# Patient Record
Sex: Male | Born: 1970 | Race: White | Hispanic: No | Marital: Single
Health system: Southern US, Community
[De-identification: ages and names within clinical notes are randomized; demographics above are authoritative.]

## PROBLEM LIST (undated history)

## (undated) DIAGNOSIS — I10 Essential (primary) hypertension: Secondary | ICD-10-CM

## (undated) DIAGNOSIS — J45909 Unspecified asthma, uncomplicated: Secondary | ICD-10-CM

## (undated) DIAGNOSIS — E119 Type 2 diabetes mellitus without complications: Secondary | ICD-10-CM

---

## 2015-11-25 ENCOUNTER — Emergency Department: Payer: Self-pay

## 2015-11-25 ENCOUNTER — Emergency Department
Admission: EM | Admit: 2015-11-25 | Discharge: 2015-11-25 | Discharge status: Against Medical Advice | Payer: Self-pay | Attending: Emergency Medicine | Admitting: Emergency Medicine

## 2015-11-25 ENCOUNTER — Encounter: Payer: Self-pay | Admitting: Medical Oncology

## 2015-11-25 DIAGNOSIS — I10 Essential (primary) hypertension: Secondary | ICD-10-CM | POA: Insufficient documentation

## 2015-11-25 DIAGNOSIS — R739 Hyperglycemia, unspecified: Secondary | ICD-10-CM

## 2015-11-25 DIAGNOSIS — M25511 Pain in right shoulder: Secondary | ICD-10-CM

## 2015-11-25 DIAGNOSIS — R000 Tachycardia, unspecified: Secondary | ICD-10-CM

## 2015-11-25 DIAGNOSIS — F1123 Opioid dependence with withdrawal: Secondary | ICD-10-CM | POA: Insufficient documentation

## 2015-11-25 DIAGNOSIS — F1193 Opioid use, unspecified with withdrawal: Secondary | ICD-10-CM

## 2015-11-25 DIAGNOSIS — E1165 Type 2 diabetes mellitus with hyperglycemia: Secondary | ICD-10-CM | POA: Insufficient documentation

## 2015-11-25 DIAGNOSIS — J45909 Unspecified asthma, uncomplicated: Secondary | ICD-10-CM | POA: Insufficient documentation

## 2015-11-25 HISTORY — DX: Type 2 diabetes mellitus without complications: E11.9

## 2015-11-25 HISTORY — DX: Unspecified asthma, uncomplicated: J45.909

## 2015-11-25 HISTORY — DX: Essential (primary) hypertension: I10

## 2015-11-25 LAB — BASIC METABOLIC PANEL
Anion gap: 9 (ref 5–15)
BUN: 7 mg/dL (ref 6–20)
CO2: 25 mmol/L (ref 22–32)
CREATININE: 0.68 mg/dL (ref 0.61–1.24)
Calcium: 9.1 mg/dL (ref 8.9–10.3)
Chloride: 97 mmol/L — ABNORMAL LOW (ref 101–111)
GFR calc Af Amer: >60 mL/min (ref >60)
Glucose, Bld: 579 mg/dL (ref 65–99)
POTASSIUM: 3.8 mmol/L (ref 3.5–5.1)
SODIUM: 131 mmol/L — AB (ref 135–145)

## 2015-11-25 LAB — CBC
HEMATOCRIT: 43.9 % (ref 40.0–52.0)
Hemoglobin: 15.5 g/dL (ref 13.0–18.0)
MCH: 31.1 pg (ref 26.0–34.0)
MCHC: 35.4 g/dL (ref 32.0–36.0)
MCV: 88 fL (ref 80.0–100.0)
PLATELETS: 496 10*3/uL — AB (ref 150–440)
RBC: 4.99 MIL/uL (ref 4.40–5.90)
RDW: 12.8 % (ref 11.5–14.5)
WBC: 8.9 10*3/uL (ref 3.8–10.6)

## 2015-11-25 LAB — TROPONIN I

## 2015-11-25 MED ORDER — SODIUM CHLORIDE 0.9 % IV BOLUS (SEPSIS)
1000.0000 mL | INTRAVENOUS | Status: AC
  Administered 2015-11-25: 1000 mL via INTRAVENOUS

## 2015-11-25 NOTE — ED Triage Notes (Addendum)
Pt reports that he has been having bilt shoulder pain and sob x 1 week. Pt reports he was sent from [facility] and he feels really anxious. Took ativan at 0900.

## 2015-11-25 NOTE — ED Notes (Signed)
Patient becomes irate during interview. Refuses to stay, threatening MD, insists IV removed.

## 2015-11-25 NOTE — Discharge Instructions (Signed)
As we discussed, we believe your symptoms of pain and rapid heart rate are most likely due to opioid withdrawal.  We are giving IV fluids to try to help you with your high blood sugar as well as getting x-rays of your shoulder, but you are refusing any additional treatment and leaving against medical advice.  Please know you can return to the Emergency Department at any time for further evaluation.

## 2015-11-25 NOTE — ED Notes (Signed)
Blood sugar 579, Dr.Stafford notified

## 2015-11-25 NOTE — ED Notes (Signed)
Patient has 800 ml NS infused.

## 2015-11-25 NOTE — ED Provider Notes (Signed)
[facility] Emergency Department Provider Note  ____________________________________________   First MD Initiated Contact with Patient 11/25/15 1346     (approximate)  I have reviewed the triage vital signs and the nursing notes.   HISTORY  Chief Complaint Shoulder Pain and Shortness of Breath   HPI Nathan Keller is a 45 y.o. male who reports a history of untreated and uncontrolled diabetes, hypertension, heart attack last year, and ongoing heroin addiction for which he just went into his residential treatment services [facility]yesterday for treatment.  He states that he last used heroin more than 24 hours ago.  He presents today for evaluation of bilateral shoulder pain.  He reports that his shoulders have been hurting him since his heart attack last year.  It is much worse today.  He denies fever/chills, chest pain, shortness of breath, nausea, vomiting, diarrhea, abdominal pain.  It is worse with movement and he describes the pain as sharp and severe particularly in his right shoulder blade.  Of note, he was brought back to an exam room quickly because his labs showed that his blood sugar was nearly 600.  He states that this is always the case and that he does not look after his diabetes or his high blood sugar in general.   Past Medical History:  Diagnosis Date  . Asthma   . Diabetes mellitus without complication (HCC)   . Hypertension     There are no active problems to display for this patient.   History reviewed. No pertinent surgical history.  Prior to Admission medications   Not on File    Allergies Hydrocodone  No family history on file.  Social History Social History  Substance Use Topics  . Smoking status: Not on file  . Smokeless tobacco: Not on file  . Alcohol use Not on file    Review of Systems Constitutional: No fever/chills Eyes: No visual changes. ENT: No sore throat. Cardiovascular: Denies chest pain. Respiratory:  Denies shortness of breath. Gastrointestinal: No abdominal pain.  No nausea, no vomiting.  No diarrhea.  No constipation. Genitourinary: Negative for dysuria. Musculoskeletal: Negative for back pain. Skin: Negative for rash. Neurological: Negative for headaches, focal weakness or numbness.  10-point ROS otherwise negative.  ____________________________________________   PHYSICAL EXAM:  VITAL SIGNS: ED Triage Vitals  Enc Vitals Group     BP 11/25/15 1215 117/81     Pulse Rate 11/25/15 1215 (!) 135     Resp 11/25/15 1215 20     Temp 11/25/15 1215 98.4 F (36.9 C)     Temp Source 11/25/15 1215 Oral     SpO2 11/25/15 1215 97 %     Weight 11/25/15 1213 145 lb (65.8 kg)     Height 11/25/15 1213 6' (1.829 m)     Head Circumference --      Peak Flow --      Pain Score 11/25/15 1213 8     Pain Loc --      Pain Edu? --      Excl. in GC? --     Constitutional: The patient is sleeping soundly when I first entered the exam room but awoke to light voice. Eyes: Conjunctivae are normal. PERRL. EOMI. Head: Atraumatic. Nose: No congestion/rhinnorhea. Mouth/Throat: Mucous membranes are moist.  Oropharynx non-erythematous. Neck: No stridor.  No meningeal signs.   Cardiovascular: Tachycardia, regular rhythm. Good peripheral circulation. Grossly normal heart sounds. Respiratory: Normal respiratory effort.  No retractions. Lungs CTAB. Gastrointestinal: Multiple body habitus.  Soft  and nontender. No distention.  Musculoskeletal: No lower extremity tenderness nor edema. No gross deformities of extremities.  Audible crepitus when the patient is fully ranging his right shoulder and shoulder blade, but there is no limitation to the range of motion.  He reports tenderness upon range of motion.  Neurovascularly intact distally. Neurologic:  Normal speech and language. No gross focal neurologic deficits are appreciated.  No tremor. Skin:  Skin is warm, dry and intact. No rash noted. Psychiatric: Mood  and affect are aggressive and hostile after he decided I was not going to give him pain medicine.   ____________________________________________   LABS (all labs ordered are listed, but only abnormal results are displayed)  Labs Reviewed  BASIC METABOLIC PANEL - Abnormal; Notable for the following:       Result Value   Sodium 131 (*)    Chloride 97 (*)    Glucose, Bld 579 (*)    All other components within normal limits  CBC - Abnormal; Notable for the following:    Platelets 496 (*)    All other components within normal limits  TROPONIN I   ____________________________________________  EKG  ED ECG REPORT I, Ivie Maese, the attending physician, personally viewed and interpreted this ECG.  Date: 11/25/2015 EKG Time: 12:13 Rate: 135 Rhythm: sinus tachycardia QRS Axis: normal Intervals: normal ST/T Wave abnormalities: normal Conduction Disturbances: none Narrative Interpretation: no evidence of acute ischemia  ____________________________________________  RADIOLOGY   Dg Chest 2 View  Result Date: 11/25/2015 CLINICAL DATA:  Chest pain short of breath EXAM: CHEST  2 VIEW COMPARISON:  None. FINDINGS: Heart size within normal limits. Right and left coronary stents. Plate and screw fixation of the sternum. Negative for heart failure. Lungs are clear without infiltrate or effusion. Negative for mass lesion. IMPRESSION: No active cardiopulmonary disease. Electronically Signed   By: Marlan Palauharles  Clark M.D.   On: 11/25/2015 13:25    ____________________________________________   PROCEDURES  Procedure(s) performed:   Procedures   Critical Care performed: No ____________________________________________   INITIAL IMPRESSION / ASSESSMENT AND PLAN / ED COURSE  Pertinent labs & imaging results that were available during my care of the patient were reviewed by me and considered in my medical decision making (see chart for details).  After gently awakening the patient we  had a conversation about his workup.  I explained that his chest x-ray was unremarkable including his bones that are visible on the chest film.  I explained to him that his blood sugars extremely elevated but I understand that it is typically elevated, but that was why we are giving him a liter bolus of fluids.  He denied having any recent traumas or injuries to his shoulders.  I was in the process of trying to explain that I felt that his pain was worse and he was feeling worse in general today because of heroin withdrawal and he immediately started ripping off all of the cardiac leads.  I asked him what he was doing and he said "were done here, I can tell you ain't gonna do nothing."  I explained to him that I wanted to try to help him but he was very agitated and upset and began reaching for his peripheral IV.  I pointed at him and sternly told him not to remove the IV or he would hurt himself and make a big mess and if he will just wait a second I will take it out myself.  He threatened to break my finger if  I did not get it away and continue to issue threats and act verbally aggressive.  A nurse walked by and I asked her to get a police officer to the room.  The patient did not act physically aggressive but continue to claim that I was judging him because he is an addict and that I would not do anything for him.  Continue to explain that I am happy to treat him for his high blood sugar and to obtain x-rays of his shoulder to make sure there is no acute injury, but he refused any additional treatment and stormed out of the exam room AGAINST MEDICAL ADVICE once we removed the peripheral IV in the company of the police officer who escorted him to the front.  I prepared paperwork for him but I am uncertain if he accepted it from the nurse.    ____________________________________________  FINAL CLINICAL IMPRESSION(S) / ED DIAGNOSES  Final diagnoses:  Hyperglycemia  Opioid withdrawal (HCC)  Shoulder pain,  acute, right  Tachycardia     MEDICATIONS GIVEN DURING THIS VISIT:  Medications  sodium chloride 0.9 % bolus 1,000 mL (1,000 mLs Intravenous New Bag/Given 11/25/15 1331)     NEW OUTPATIENT MEDICATIONS STARTED DURING THIS VISIT:  New Prescriptions   No medications on file      Note:  This document was prepared using Dragon voice recognition software and may include unintentional dictation errors.    Loleta Rose, MD 11/25/15 [phone]

## 2018-03-27 IMAGING — CR DG CHEST 2V
1 series · 2 of 2 positions shown · non-contrast
Comparison: None.

CLINICAL DATA: Chest pain short of breath

EXAM:
CHEST  2 VIEW

[Series 1: w chest pa · 0.14mm/px · 2 of 2 slices shown]
[im 1/2]
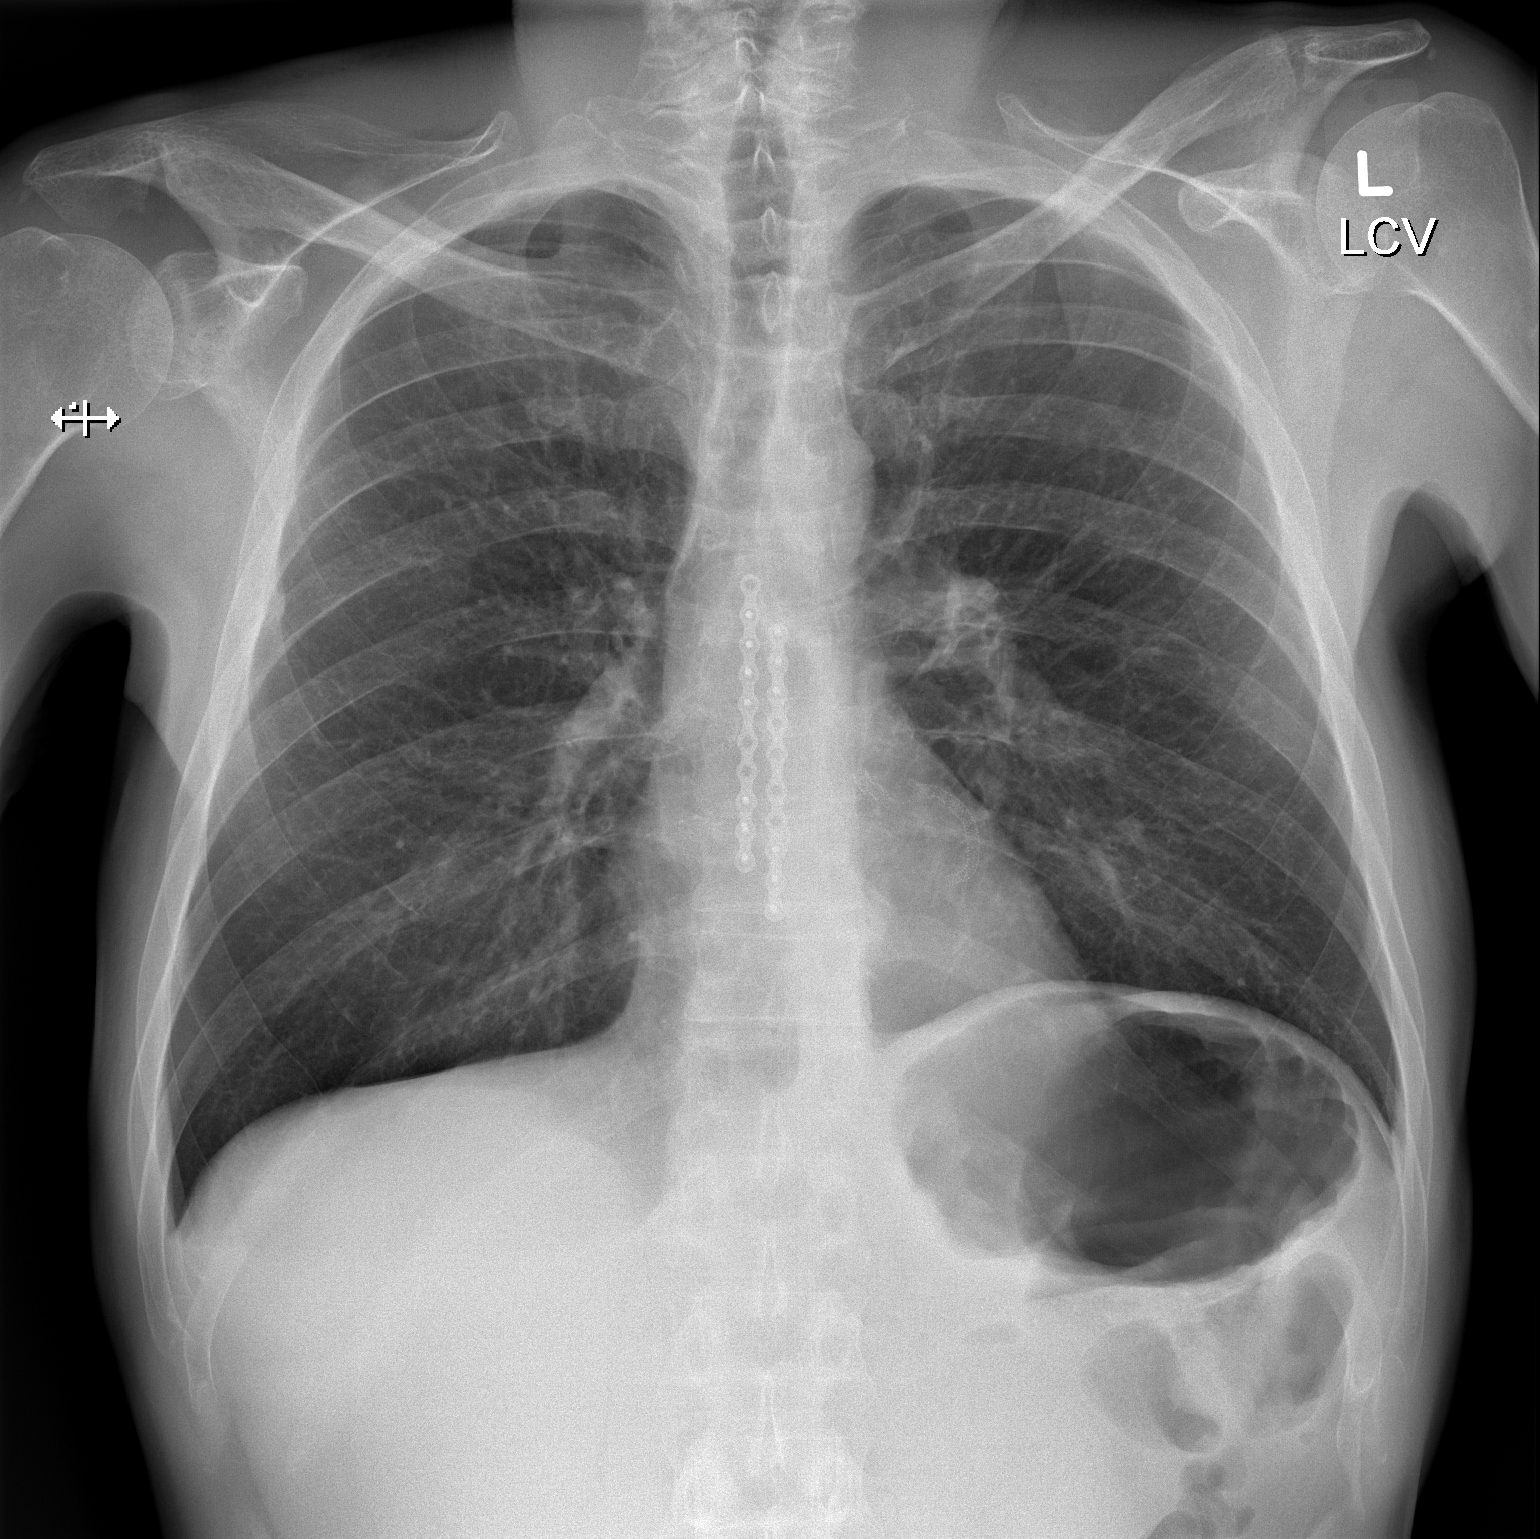
[im 2/2]
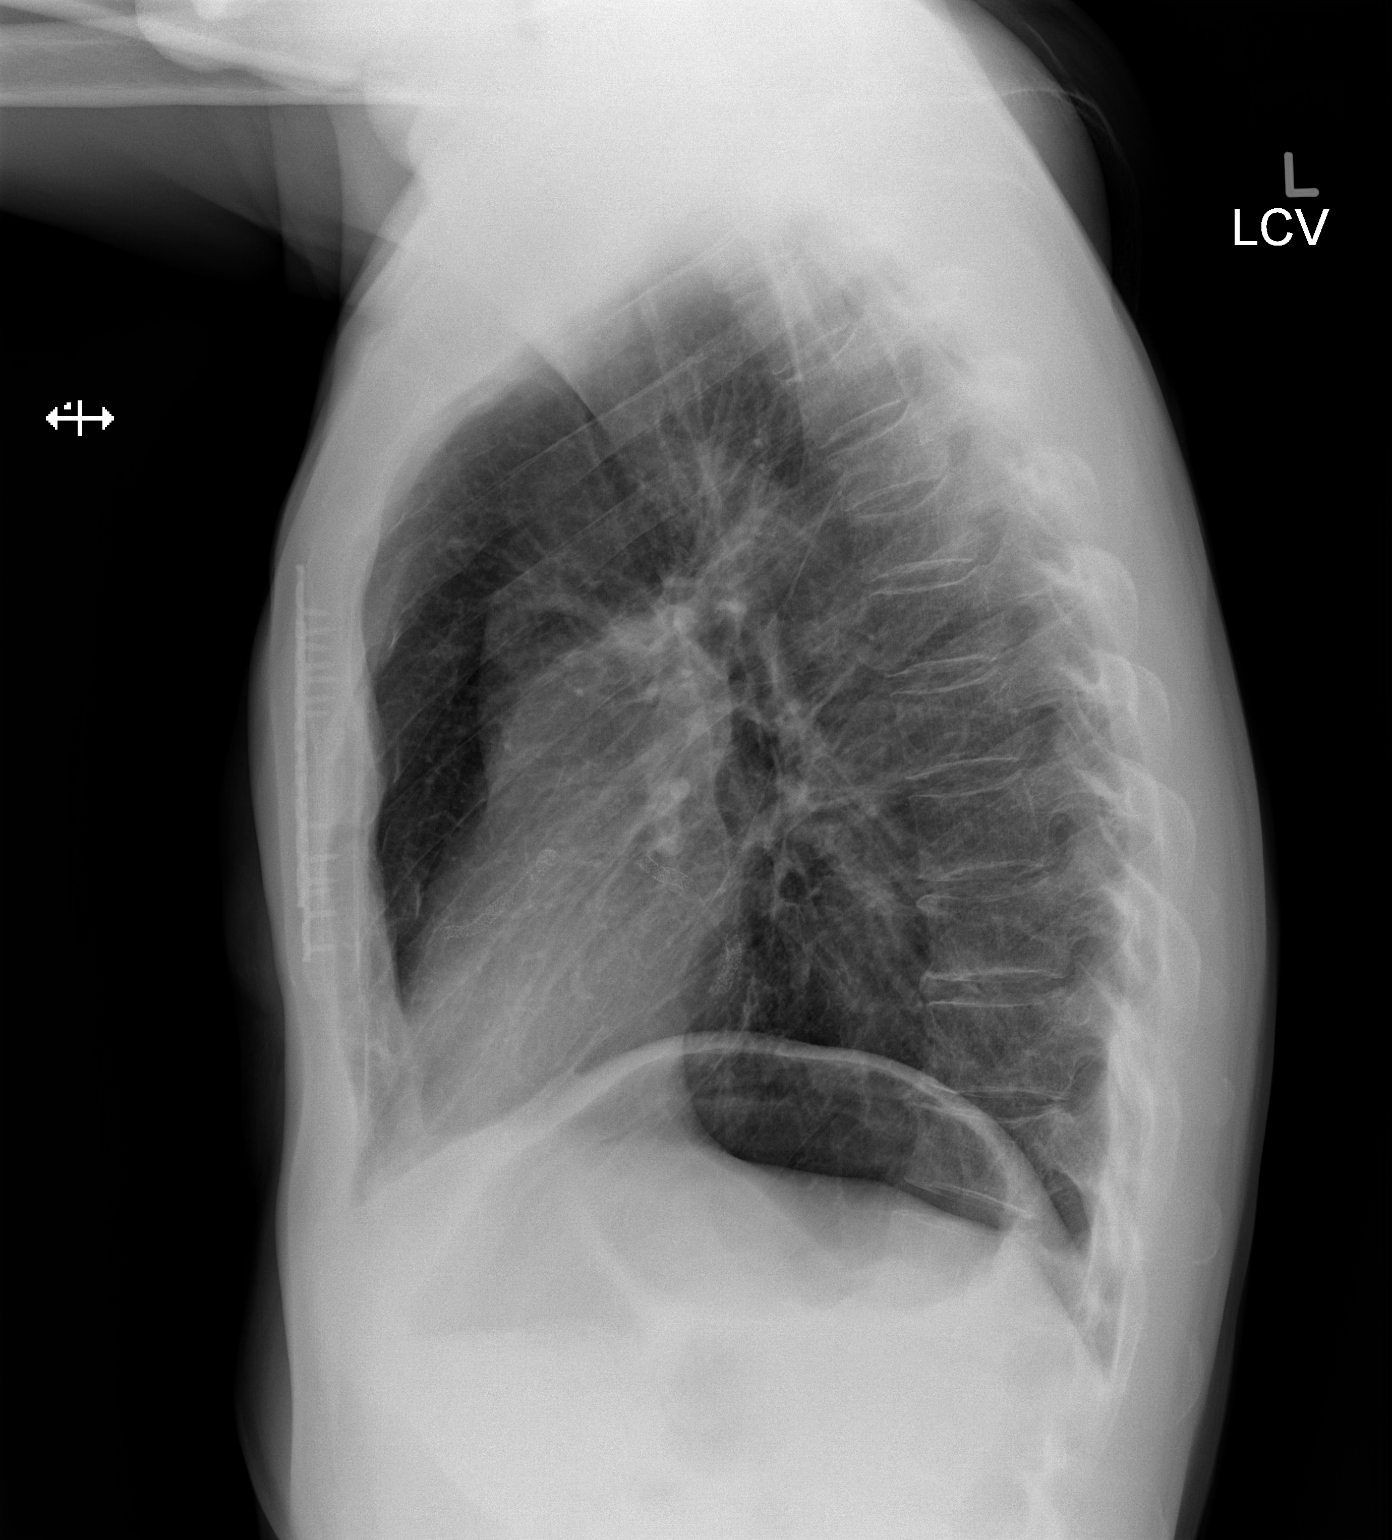

[2 of 2 positions shown; findings below may reference images not displayed]

FINDINGS: Heart size within normal limits. Right and left coronary stents.
Plate and screw fixation of the sternum.

Negative for heart failure. Lungs are clear without infiltrate or
effusion. Negative for mass lesion.
IMPRESSION: No active cardiopulmonary disease.
